# Patient Record
Sex: Male | Born: 2005 | Race: Black or African American | Hispanic: No | Marital: Single | Smoking: Never smoker
Health system: Southern US, Community
[De-identification: ages and names within clinical notes are randomized; demographics above are authoritative.]

---
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
## 2023-01-18 ENCOUNTER — Other Ambulatory Visit: Payer: Self-pay

## 2023-01-18 ENCOUNTER — Ambulatory Visit: Payer: Medicaid Other | Admitting: Internal Medicine

## 2023-01-18 ENCOUNTER — Ambulatory Visit (INDEPENDENT_AMBULATORY_CARE_PROVIDER_SITE_OTHER): Payer: Medicaid Other | Admitting: Student

## 2023-01-18 ENCOUNTER — Ambulatory Visit: Payer: BC Managed Care – PPO

## 2023-01-18 ENCOUNTER — Encounter: Payer: Self-pay | Admitting: Student

## 2023-01-18 VITALS — BP 117/75 | HR 77 | Ht 69.0 in | Wt 162.4 lb

## 2023-01-18 DIAGNOSIS — J4520 Mild intermittent asthma, uncomplicated: Secondary | ICD-10-CM

## 2023-01-18 DIAGNOSIS — G8929 Other chronic pain: Secondary | ICD-10-CM | POA: Diagnosis not present

## 2023-01-18 DIAGNOSIS — M545 Low back pain, unspecified: Secondary | ICD-10-CM | POA: Diagnosis not present

## 2023-01-18 DIAGNOSIS — Z23 Encounter for immunization: Secondary | ICD-10-CM | POA: Diagnosis not present

## 2023-01-18 NOTE — Patient Instructions (Addendum)
A strong back is a protected back.  I think you might need more time in the gym. Focusing on back strength. Also I would like to send you to phyiscal therapy. They will call you to schedule this. We'll try to make it happen in [place].   I do think you should confront the student services at [facility] about your living quarters and your asthma. I'm sending a letter with you.   Eliezer Mccoy, MD

## 2023-01-18 NOTE — Progress Notes (Deleted)
    SUBJECTIVE:   CHIEF COMPLAINT / HPI:   Back pain Conservative therapy in 08/2022 - heat, Voltaren, home exercises.  PERTINENT  PMH / PSH: ***  OBJECTIVE:   There were no vitals taken for this visit. ***  General: NAD, pleasant, able to participate in exam Cardiac: RRR, no murmurs. Respiratory: CTAB, normal effort, No wheezes, rales or rhonchi Abdomen: Bowel sounds present, nontender, nondistended Extremities: no edema or cyanosis. Skin: warm and dry, no rashes noted Neuro: alert, no obvious focal deficits Psych: Normal affect and mood  ASSESSMENT/PLAN:   No problem-specific Assessment & Plan notes found for this encounter.     Dr. Elberta Fortis, DO [facility]    {    This will disappear when note is signed, click to select method of visit    :1}

## 2023-01-20 NOTE — Progress Notes (Signed)
    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 He does go to the gym, but doesn't really target his back. He has used voltaren in the past with limited response.   Pain is bilateral and paraspinal in nature, no sciatica. No fevers, traumas, weakness, or bowel/bladder changes.   Exposure to Mold He has asthma, is worried about exposure to mold in his living quarters at [facility], wondering if there is any risk to his health from this exposure.    OBJECTIVE:   BP 117/75   Pulse 77   Ht 5\' 9"  (1.753 m)   Wt 162 lb 6.4 oz (73.7 kg)   SpO2 99%   BMI 23.98 kg/m   Gen: In good spirits, NAD Cardio: RRR, no murmur Pulm: Normal WOB on RA, lugns clear throughout without wheezes, rales, or rhonchi  MSK: Back without deformity, no midline tenderness or paraspinal spasm/tenderness. No CVA tenderness. Neg SLR, Neg FABER/FADIR.   ASSESSMENT/PLAN:   Chronic bilateral back pain Had tried some HEP exercises a few months back, but has not tried formal PT.  Discussed that since this is primarily a "movement" problem, he'd benefit from seeing a "movement specialist" in PT.  - Ambulatory referral to PT - Continue supportive care measures as discussed with Dr. Anner Crete: voltaren gel, heat, Tylenol/NSAIDs PRN  Mild intermittent asthma [facility] [place]. Also uses daily Zyrtec. 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 I have provided him a letter to pass along to school stating the importance of him being in a mold-free environment.      Eliezer Mccoy, MD [facility]

## 2023-01-20 NOTE — Assessment & Plan Note (Addendum)
Had tried some HEP exercises a few months back, but has not tried formal PT.  Discussed that since this is primarily a "movement" problem, he'd benefit from seeing a "movement specialist" in PT.  - Ambulatory referral to PT - Continue supportive care measures as discussed with Dr. Anner Crete: voltaren gel, heat, Tylenol/NSAIDs PRN

## 2023-01-20 NOTE — Assessment & Plan Note (Signed)
Follow[facility] with [facility] [place]. Also uses daily Zyrtec. 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 I have provided him a letter to pass along to school stating the importance of him being in a mold-free environment.

## 2023-01-22 DIAGNOSIS — Z419 Encounter for procedure for purposes other than remedying health state, unspecified: Secondary | ICD-10-CM | POA: Diagnosis not present

## 2023-01-25 ENCOUNTER — Ambulatory Visit (INDEPENDENT_AMBULATORY_CARE_PROVIDER_SITE_OTHER): Payer: Medicaid Other | Admitting: Internal Medicine

## 2023-01-25 ENCOUNTER — Other Ambulatory Visit: Payer: Self-pay

## 2023-01-25 ENCOUNTER — Encounter: Payer: Self-pay | Admitting: Internal Medicine

## 2023-01-25 VITALS — BP 120/78 | HR 83 | Temp 98.1°F | Resp 16 | Ht 68.31 in | Wt 154.2 lb

## 2023-01-25 DIAGNOSIS — J4520 Mild intermittent asthma, uncomplicated: Secondary | ICD-10-CM

## 2023-01-25 DIAGNOSIS — L2089 Other atopic dermatitis: Secondary | ICD-10-CM

## 2023-01-25 DIAGNOSIS — J3089 Other allergic rhinitis: Secondary | ICD-10-CM | POA: Diagnosis not present

## 2023-01-25 DIAGNOSIS — J302 Other seasonal allergic rhinitis: Secondary | ICD-10-CM

## 2023-01-25 MED ORDER — VENTOLIN HFA 108 (90 BASE) MCG/ACT IN AERS: 1.0000 | INHALATION_SPRAY | Freq: Four times a day (QID) | RESPIRATORY_TRACT | 1 refills | Status: DC | PRN

## 2023-01-25 MED ORDER — AZELASTINE HCL 0.1 % NA SOLN: 1.0000 | Freq: Two times a day (BID) | NASAL | 5 refills | Status: AC | PRN

## 2023-01-25 MED ORDER — TRIAMCINOLONE ACETONIDE 0.1 % EX OINT: TOPICAL_OINTMENT | CUTANEOUS | 5 refills | Status: AC

## 2023-01-25 MED ORDER — MONTELUKAST SODIUM 10 MG PO TABS: 10.0000 mg | ORAL_TABLET | Freq: Every day | ORAL | 5 refills | Status: DC

## 2023-01-25 MED ORDER — OLOPATADINE HCL 0.2 % OP SOLN: 1.0000 [drp] | Freq: Every day | OPHTHALMIC | 5 refills | Status: AC | PRN

## 2023-01-25 MED ORDER — CETIRIZINE HCL 10 MG PO TABS: 10.0000 mg | ORAL_TABLET | Freq: Every day | ORAL | 5 refills | Status: DC

## 2023-01-25 MED ORDER — NASONEX 50 MCG/ACT NA SUSP: 2.0000 | Freq: Every day | NASAL | 5 refills | Status: AC

## 2023-01-25 MED ORDER — FLUTICASONE PROPIONATE HFA 110 MCG/ACT IN AERO: INHALATION_SPRAY | RESPIRATORY_TRACT | 5 refills | Status: AC

## 2023-01-25 NOTE — Progress Notes (Signed)
FOLLOW UP Date of Service/Encounter:  01/25/23   Subjective:  Casey Joseph (DOB: 2006-03-02) is a 17 y.o. male [facility] for follow up for asthma, allergic rhinitis, idiopathic urticaria.   History obtained from: chart review and patient. Last visit was with Thermon Leyland on 01/29/2022  and at the time, discussed use of PRN Flovent for illness/flare ups, Singulair, Nasonex, start Zyrtec for hives.   Reports asthma has done well overall.  He rarely needs to use his inhalers about 1-2x a month usually due to SOB with activity.  Has Flovent for flare ups and Albuterol PRN.  No ER visits/oral prednisone use since the last visit.  Does have trouble with increased congestion and post nasal drip.  Using Nasonex, Zyrtec, Singulair daily.  Sometimes also requires Olopatadine for itchy watery eyes.   Reports having skin issues. Has dry skin and eczema.  Last summer, he notes small bumps and peeling of his hands that was painful.  Lasted about a month.  Used OTC moisturizer and over time it healed.  Also has eczema flare ups about 1-2x/month.   Has not used any topical steroids. Does not have pictures.   Rash was not hives/welts.  It was dry, red, peeling/flaking.   Past Medical History: History reviewed. No pertinent past medical history.  Objective:  BP 120/78   Pulse 83   Temp 98.1 F (36.7 C) (Temporal)   Resp 16   Ht 5' 8.31" (1.735 m)   Wt 154 lb 3.2 oz (69.9 kg)   SpO2 98%   BMI 23.24 kg/m  Body mass index is 23.24 kg/m. Physical Exam: GEN: alert, well developed HEENT: clear conjunctiva, TM grey and translucent, nose with moderate inferior turbinate hypertrophy, pink nasal mucosa, + clear rhinorrhea, + cobblestoning HEART: regular rate and rhythm, no murmur LUNGS: clear to auscultation bilaterally, no coughing, unlabored respiration SKIN: no rashes or lesions  Spirometry:  Tracings reviewed. His effort: Good reproducible  efforts. FVC: 4.82L FEV1: 3.52L, 94% predicted FEV1/FVC ratio: 73% Interpretation: Spirometry consistent with normal pattern.  Please see scanned spirometry results for details.   Assessment:   1. Mild intermittent asthma without complication   2. Seasonal and perennial allergic rhinitis   3. Other atopic dermatitis     Plan/Recommendations:  Mild Intermittent Asthma  - Well controlled. Normal spirometry today. MDI teaching performed.  - With respiratory illness or flare ups, start Flovent 2 puffs twice daily for 1-2 weeks.  - Maintenance inhaler: continue Singulair 10mg  daily.  - Rescue inhaler: Albuterol 2 puffs via spacer or 1 vial via nebulizer every 4-6 hours as needed for respiratory symptoms of cough, shortness of breath, or wheezing Asthma control goals:  Full participation in all desired activities (may need albuterol before activity) Albuterol use two times or less a week on average (not counting use with activity) Cough interfering with sleep two times or less a month Oral steroids no more than once a year No hospitalizations  Allergic Rhinitis - Uncontrolled, will add Azelastine.  - SPT 02/2015 positive to grass pollen, weed pollen, tree pollen, mold and cockroach - Consider saline nasal rinses as needed for nasal symptoms. Use this before any medicated nasal sprays for best result. Can try NeilMed Sinus Rinse bottle with distilled water.   - Use Nasonex 2 sprays in each nostril once a day.  - Use Azelastine 1-2 sprays each nostril twice daily as needed for runny nose, congestion, drainage.  -  Use Cetirizine 10 mg once a day.  - Use Olopatadine 1 drop in each eye once a day as needed for red or itchy eyes  Eczema: - Previously noted to have hives but rash seems more eczematous.  Could also be contact dermatitis.  Discussed taking pictures if this recurs. Will prescribe topical steroid for PRN use.  - Do a daily soaking tub bath in warm water for 10-15  minutes.  - Use a gentle, unscented cleanser at the end of the bath (such as Dove unscented bar or baby wash, or Aveeno sensitive body wash). Then rinse, pat half-way dry, and apply a gentle, unscented moisturizer cream or ointment (Cerave, Cetaphil, Eucerin, Aveeno)  all over while still damp. Dry skin makes the itching and rash of eczema worse. The skin should be moisturized with a gentle, unscented moisturizer at least twice daily.  - Use only unscented liquid laundry detergent. - Apply prescribed topical steroid (triamcinolone 0.1% to flared areas (red and thickened eczema) after the moisturizer has soaked into the skin (wait at least 30 minutes). Taper off the topical steroids as the skin improves. Do not use topical steroid for more than 7-10 days at a time.  Do not use triamcinolone on face or groin.      Return in about 3 months (around 04/27/2023).  Alesia Morin, MD [facility]

## 2023-01-25 NOTE — Patient Instructions (Addendum)
Mild Intermittent Asthma  - With respiratory illness or flare ups, start Flovent 2 puffs twice daily for 1-2 weeks.  - Maintenance inhaler: continue Singulair 10mg  daily.  - Rescue inhaler: Albuterol 2 puffs via spacer or 1 vial via nebulizer every 4-6 hours as needed for respiratory symptoms of cough, shortness of breath, or wheezing Asthma control goals:  Full participation in all desired activities (may need albuterol before activity) Albuterol use two times or less a week on average (not counting use with activity) Cough interfering with sleep two times or less a month Oral steroids no more than once a year No hospitalizations  Allergic Rhinitis - SPT 02/2015 positive to grass pollen, weed pollen, tree pollen, mold and cockroach - Consider saline nasal rinses as needed for nasal symptoms. Use this before any medicated nasal sprays for best result. Can try NeilMed Sinus Rinse bottle with distilled water.   - Use Nasonex 2 sprays in each nostril once a day.  - Use Azelastine 1-2 sprays each nostril twice daily as needed for runny nose, congestion, drainage.  - Use Cetirizine 10 mg once a day.  - Use Olopatadine 0.2% 1 drop in each eye once a day as needed for red or itchy eyes  Eczema: - Take pictures of rashes for me.  - Do a daily soaking tub bath in warm water for 10-15 minutes.  - Use a gentle, unscented cleanser at the end of the bath (such as Dove unscented bar or baby wash, or Aveeno sensitive body wash). Then rinse, pat half-way dry, and apply a gentle, unscented moisturizer cream or ointment (Cerave, Cetaphil, Eucerin, Aveeno)  all over while still damp. Dry skin makes the itching and rash of eczema worse. The skin should be moisturized with a gentle, unscented moisturizer at least twice daily.  - Use only unscented liquid laundry detergent. - Apply prescribed topical steroid (triamcinolone 0.1% to flared areas (red and thickened eczema) after the moisturizer has soaked into  the skin (wait at least 30 minutes). Taper off the topical steroids as the skin improves. Do not use topical steroid for more than 7-10 days at a time.  Do not use triamcinolone on face or groin.

## 2023-02-22 DIAGNOSIS — Z419 Encounter for procedure for purposes other than remedying health state, unspecified: Secondary | ICD-10-CM | POA: Diagnosis not present

## 2023-03-24 DIAGNOSIS — Z419 Encounter for procedure for purposes other than remedying health state, unspecified: Secondary | ICD-10-CM | POA: Diagnosis not present

## 2023-04-24 DIAGNOSIS — Z419 Encounter for procedure for purposes other than remedying health state, unspecified: Secondary | ICD-10-CM | POA: Diagnosis not present

## 2023-05-01 ENCOUNTER — Encounter: Payer: Self-pay | Admitting: Internal Medicine

## 2023-05-01 ENCOUNTER — Ambulatory Visit (INDEPENDENT_AMBULATORY_CARE_PROVIDER_SITE_OTHER): Payer: Medicaid Other | Admitting: Internal Medicine

## 2023-05-01 ENCOUNTER — Other Ambulatory Visit: Payer: Self-pay

## 2023-05-01 VITALS — BP 124/68 | HR 90 | Temp 98.4°F | Wt 161.8 lb

## 2023-05-01 DIAGNOSIS — J4520 Mild intermittent asthma, uncomplicated: Secondary | ICD-10-CM | POA: Diagnosis not present

## 2023-05-01 DIAGNOSIS — J3089 Other allergic rhinitis: Secondary | ICD-10-CM

## 2023-05-01 DIAGNOSIS — J302 Other seasonal allergic rhinitis: Secondary | ICD-10-CM

## 2023-05-01 DIAGNOSIS — L2089 Other atopic dermatitis: Secondary | ICD-10-CM

## 2023-05-01 MED ORDER — CETIRIZINE HCL 10 MG PO TABS: 10.0000 mg | ORAL_TABLET | Freq: Every day | ORAL | 5 refills | Status: AC | PRN

## 2023-05-01 MED ORDER — VENTOLIN HFA 108 (90 BASE) MCG/ACT IN AERS: 1.0000 | INHALATION_SPRAY | Freq: Four times a day (QID) | RESPIRATORY_TRACT | 1 refills | Status: AC | PRN

## 2023-05-01 MED ORDER — MONTELUKAST SODIUM 10 MG PO TABS: 10.0000 mg | ORAL_TABLET | Freq: Every day | ORAL | 5 refills | Status: AC

## 2023-05-01 NOTE — Progress Notes (Signed)
 FOLLOW UP Date of Service/Encounter:  05/01/23   Subjective:  Casey Joseph (DOB: [birth year]) is a 18 y.o. male who [facility] for follow up for asthma, allergic rhinitis, eczema  History obtained from: chart review and patient. Last seen by me on 01/25/2023 and at the time, asthma was well controlled on Flovent  for illness and Albuterol  PRN. Allergies were flaring, discussed use of Nasonex , Azelastine , Zyrtec .  Also having outbreak of rashes, thought to be eczema/contact dermatitis.  Started on topical steroids.   Asthma Control Test: ACT Total Score: 24.   Since last visit, asthma has done very well.  Not much issues with SOB/wheezing.  No ER visits/oral prednisone use. Has not needed Flovent  or Albuterol  much.  Has not been sick recently either. Taking Singulair .  For allergies, usually worse in Spring/Summer.  Has some thin runny secretions with cold weather but otherwise no congestion/sneezing/itchy watery eyes.  Uses Nasonex /Zyrtec  PRN.  Has not used Olopatadine  eye drops or Azelastine .  Skin is doing well. Recently got laser tattoo removal so that area is healing.  Has not had outbreaks requiring topical steroids though.  Does not moisturize regularly.   Past Medical History: History reviewed. No pertinent past medical history.  Objective:  BP 124/68 (BP Location: Left Arm, Patient Position: Sitting, Cuff Size: Normal)   Pulse 90   Temp 98.4 F (36.9 C) (Temporal)   Wt 161 lb 12.8 oz (73.4 kg)   SpO2 98%   BMI 24.38 kg/m  Body mass index is 24.38 kg/m. Physical Exam: GEN: alert, well developed HEENT: clear conjunctiva, nose with mild inferior turbinate hypertrophy, pink nasal mucosa, + clear rhinorrhea, no cobblestoning HEART: regular rate and rhythm, no murmur LUNGS: clear to auscultation bilaterally, no coughing, unlabored respiration SKIN: no rashes or lesions, dry skin on extremities   Spirometry:  Tracings  reviewed. His effort: Good reproducible efforts. FVC: 5.10L, 116% predicted  FEV1: 3.85L, 102% predicted FEV1/FVC ratio: 75% Interpretation: Spirometry consistent with normal pattern.  Please see scanned spirometry results for details.  Assessment:   1. Seasonal and perennial allergic rhinitis   2. Other atopic dermatitis   3. Mild intermittent asthma without complication     Plan/Recommendations:  Mild Intermittent Asthma  - Well controlled, normal spirometry.  If doing well, can consider stopping Singulair .  MDI technique discussed.  - With respiratory illness or flare ups, start Flovent  110mcg 2 puffs twice daily for 1-2 weeks.  - Maintenance inhaler: continue Singulair  10mg  daily.  - Rescue inhaler: Albuterol  2 puffs via spacer or 1 vial via nebulizer every 4-6 hours as needed for respiratory symptoms of cough, shortness of breath, or wheezing Asthma control goals:  Full participation in all desired activities (may need albuterol  before activity) Albuterol  use two times or less a week on average (not counting use with activity) Cough interfering with sleep two times or less a month Oral steroids no more than once a year No hospitalizations  Allergic Rhinitis - Controlled. - SPT 02/2015 positive to grass pollen, weed pollen, tree pollen, mold and cockroach - Consider saline nasal rinses as needed for nasal symptoms. Use this before any medicated nasal sprays for best result. Can try NeilMed Sinus Rinse bottle with distilled water.   - Use Nasonex  2 sprays in each nostril once a day.  - Use Azelastine  1-2 sprays each nostril twice daily as needed for runny nose, congestion, drainage.  - Use Cetirizine  10 mg once a day.  -  Use Olopatadine  0.2% 1 drop in each eye once a day as needed for red or itchy eyes - Consider allergy shots as long term therapy.  Would need to be retested.   Eczema: - Controlled, no recent flares.  - Take pictures of rashes for me.  - Do a daily soaking  tub bath in warm water for 10-15 minutes.  - Use a gentle, unscented cleanser at the end of the bath (such as Dove unscented bar or baby wash, or Aveeno sensitive body wash). Then rinse, pat half-way dry, and apply a gentle, unscented moisturizer cream or ointment (Cerave, Cetaphil, Eucerin, Aveeno)  all over while still damp. Dry skin makes the itching and rash of eczema worse. The skin should be moisturized with a gentle, unscented moisturizer at least twice daily.  - Use only unscented liquid laundry detergent. - Apply prescribed topical steroid (triamcinolone  0.1% to flared areas (red and thickened eczema) after the moisturizer has soaked into the skin (wait at least 30 minutes). Taper off the topical steroids as the skin improves. Do not use topical steroid for more than 7-10 days at a time.  Do not use triamcinolone  on face or groin.     Return in about 6 months (around 10/29/2023).  Arleta Blanch, MD [facility] 

## 2023-05-01 NOTE — Patient Instructions (Addendum)
 Mild Intermittent Asthma  - Well controlled, normal spirometry.  If doing well, can consider stopping Singulair .   - With respiratory illness or flare ups, start Flovent  110mcg 2 puffs twice daily for 1-2 weeks.  - Maintenance inhaler: continue Singulair  10mg  daily.  - Rescue inhaler: Albuterol  2 puffs via spacer or 1 vial via nebulizer every 4-6 hours as needed for respiratory symptoms of cough, shortness of breath, or wheezing Asthma control goals:  Full participation in all desired activities (may need albuterol  before activity) Albuterol  use two times or less a week on average (not counting use with activity) Cough interfering with sleep two times or less a month Oral steroids no more than once a year No hospitalizations  Allergic Rhinitis - Controlled - SPT 02/2015 positive to grass pollen, weed pollen, tree pollen, mold and cockroach - Consider saline nasal rinses as needed for nasal symptoms. Use this before any medicated nasal sprays for best result. Can try NeilMed Sinus Rinse bottle with distilled water.   - Use Nasonex  2 sprays in each nostril once a day.  - Use Azelastine  1-2 sprays each nostril twice daily as needed for runny nose, congestion, drainage.  - Use Cetirizine  10 mg once a day.  - Use Olopatadine  0.2% 1 drop in each eye once a day as needed for red or itchy eyes - Consider allergy shots as long term therapy.  Would need to be retested.   Eczema: - Take pictures of rashes for me.  - Do a daily soaking tub bath in warm water for 10-15 minutes.  - Use a gentle, unscented cleanser at the end of the bath (such as Dove unscented bar or baby wash, or Aveeno sensitive body wash). Then rinse, pat half-way dry, and apply a gentle, unscented moisturizer cream or ointment (Cerave, Cetaphil, Eucerin, Aveeno)  all over while still damp. Dry skin makes the itching and rash of eczema worse. The skin should be moisturized with a gentle, unscented moisturizer at least twice daily.  -  Use only unscented liquid laundry detergent. - Apply prescribed topical steroid (triamcinolone  0.1% to flared areas (red and thickened eczema) after the moisturizer has soaked into the skin (wait at least 30 minutes). Taper off the topical steroids as the skin improves. Do not use topical steroid for more than 7-10 days at a time.  Do not use triamcinolone  on face or groin.

## 2023-05-25 DIAGNOSIS — Z419 Encounter for procedure for purposes other than remedying health state, unspecified: Secondary | ICD-10-CM | POA: Diagnosis not present

## 2023-06-22 DIAGNOSIS — Z419 Encounter for procedure for purposes other than remedying health state, unspecified: Secondary | ICD-10-CM | POA: Diagnosis not present

## 2023-08-03 DIAGNOSIS — Z419 Encounter for procedure for purposes other than remedying health state, unspecified: Secondary | ICD-10-CM | POA: Diagnosis not present

## 2023-09-02 DIAGNOSIS — Z419 Encounter for procedure for purposes other than remedying health state, unspecified: Secondary | ICD-10-CM | POA: Diagnosis not present

## 2023-10-03 DIAGNOSIS — Z419 Encounter for procedure for purposes other than remedying health state, unspecified: Secondary | ICD-10-CM | POA: Diagnosis not present

## 2023-11-02 DIAGNOSIS — Z419 Encounter for procedure for purposes other than remedying health state, unspecified: Secondary | ICD-10-CM | POA: Diagnosis not present

## 2023-12-03 DIAGNOSIS — Z419 Encounter for procedure for purposes other than remedying health state, unspecified: Secondary | ICD-10-CM | POA: Diagnosis not present

## 2024-01-03 DIAGNOSIS — Z419 Encounter for procedure for purposes other than remedying health state, unspecified: Secondary | ICD-10-CM | POA: Diagnosis not present

## 2024-02-02 DIAGNOSIS — Z419 Encounter for procedure for purposes other than remedying health state, unspecified: Secondary | ICD-10-CM | POA: Diagnosis not present
